# Patient Record
Sex: Male | Born: 2000 | Race: Black or African American | Hispanic: No | Marital: Single
Health system: Southern US, Community
[De-identification: ages and names within clinical notes are randomized; demographics above are authoritative.]

## PROBLEM LIST (undated history)

## (undated) DIAGNOSIS — J45909 Unspecified asthma, uncomplicated: Secondary | ICD-10-CM

## (undated) DIAGNOSIS — J302 Other seasonal allergic rhinitis: Secondary | ICD-10-CM

## (undated) HISTORY — PX: CIRCUMCISION: SHX1350

## (undated) HISTORY — PX: HERNIA REPAIR: SHX51

## (undated) HISTORY — PX: OTHER SURGICAL HISTORY: SHX169

---
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
## 2019-07-30 ENCOUNTER — Other Ambulatory Visit: Payer: Self-pay

## 2019-07-30 ENCOUNTER — Encounter (HOSPITAL_COMMUNITY): Payer: Self-pay

## 2019-07-30 ENCOUNTER — Ambulatory Visit (HOSPITAL_COMMUNITY)
Admission: EM | Admit: 2019-07-30 | Discharge: 2019-07-30 | Disposition: A | Discharge status: Home / Self Care | Payer: No Typology Code available for payment source | Attending: Emergency Medicine | Admitting: Emergency Medicine

## 2019-07-30 DIAGNOSIS — R0789 Other chest pain: Secondary | ICD-10-CM

## 2019-07-30 MED ORDER — NAPROXEN 500 MG PO TABS: 500.0000 mg | ORAL_TABLET | Freq: Two times a day (BID) | ORAL | 0 refills | Status: DC

## 2019-07-30 NOTE — ED Provider Notes (Signed)
[facility]    CSN: [id number] Arrival date & time: 07/30/19  1856      History   Chief Complaint Chief Complaint  Patient presents with  . Chest Pain    HPI Dale Gillespie is a 19 y.o. male history of asthma, allergic rhinitis, presenting today for evaluation of chest pain.  Patient developed left-sided chest pain while he was jogging approximately 1 hour ago.  Felt a tight sensation in his lower rib cage that radiates into his back.  Mainly feels this when he takes a deep breath.  Denies any associated cough, fevers chills or body aches or recent illness.  Denies any symptoms on the right side.  Denies history of similar.  Denies any history of heart issues.  Denies family history of any heart issues or death from MI at early age.  Denies hypertension, diabetes.  Denies tobacco use.  Denies cocaine use.  Denies prior DVT/PE.  Denies leg pain or leg swelling.  Denies history of chest pain with exertion, lightheadedness or dizziness with exertion/physical activity, denies prior fainting spells with exertion.  HPI  Past Medical History:  Diagnosis Date  . Asthma   . Seasonal allergies     Patient Active Problem List   Diagnosis Date Noted  . 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 benign mass on finger    . CIRCUMCISION    . HERNIA REPAIR         Home Medications    Prior to Admission medications   Medication Sig Start Date End Date Taking? Authorizing Provider  cetirizine (ZYRTEC) 10 MG tablet Take 10 mg by mouth daily.    [provider]  fluticasone (FLONASE) 50 MCG/ACT nasal spray Place into both nostrils daily.    [provider]  naproxen (NAPROSYN) 500 MG tablet Take 1 tablet (500 mg total) by mouth 2 (two) times daily. 07/30/19   Jadarian Mckay C, PA-C  omeprazole (PRILOSEC) 20 MG capsule Take 1 capsule (20 mg total) by mouth daily. 10/18/18   Linus Mako B, NP  polyethylene  glycol powder (GLYCOLAX/MIRALAX) 17 GM/SCOOP powder Take 17 g by mouth daily. 10/18/18   Georgetta Haber, NP  PROAIR HFA 108 [phone] Base) MCG/ACT inhaler USE 2 PUFFS EVERY 4 HOURS FOR COUGH/WHEEZE/SHORTNESS OF BREATH/EXERCISE 04/11/16   [provider]    Family History No family history on file.  Social History Social History   Tobacco Use  . Smoking status: Passive Smoke Exposure - Never Smoker  . Smokeless tobacco: Never Used  Substance Use Topics  . Alcohol use: No  . Drug use: Yes    Types: Marijuana    Comment: occasional use     Allergies   Patient has no known allergies.   Review of Systems Review of Systems  Constitutional: Negative for fatigue and fever.  HENT: Negative for congestion, sinus pressure and sore throat.   Eyes: Negative for photophobia, pain and visual disturbance.  Respiratory: Negative for cough and shortness of breath.   Cardiovascular: Positive for chest pain. Negative for palpitations and leg swelling.  Gastrointestinal: Negative for abdominal pain, nausea and vomiting.  Genitourinary: Negative for decreased urine volume and hematuria.  Musculoskeletal: Negative for myalgias, neck pain and neck stiffness.  Neurological: Negative for dizziness, syncope, facial asymmetry, speech difficulty, weakness, light-headedness, numbness and headaches.     Physical Exam Triage Vital Signs ED Triage Vitals  Enc Vitals Group  BP 07/30/19 1918 123/81     Pulse Rate 07/30/19 1918 93     Resp 07/30/19 1918 16     Temp 07/30/19 1918 98.1 F (36.7 C)     Temp Source 07/30/19 1918 Oral     SpO2 07/30/19 1918 98 %     Weight 07/30/19 1916 198 lb (89.8 kg)     Height --      Head Circumference --      Peak Flow --      Pain Score 07/30/19 1916 6     Pain Loc --      Pain Edu? --      Excl. in [place]? --    No data found.  Updated Vital Signs BP 123/81 (BP Location: Left Arm)   Pulse 93   Temp 98.1 F (36.7 C) (Oral)   Resp 16   Wt 198 lb  (89.8 kg)   SpO2 98%   BMI 25.77 kg/m   Visual Acuity Right Eye Distance:   Left Eye Distance:   Bilateral Distance:    Right Eye Near:   Left Eye Near:    Bilateral Near:     Physical Exam Vitals and nursing note reviewed.  Constitutional:      Appearance: He is well-developed.     Comments: No acute distress  HENT:     Head: Normocephalic and atraumatic.     Nose: Nose normal.  Eyes:     Conjunctiva/sclera: Conjunctivae normal.  Cardiovascular:     Rate and Rhythm: Normal rate.  Pulmonary:     Effort: Pulmonary effort is normal. No respiratory distress.     Comments: Breathing comfortably at rest, CTABL, no wheezing, rales or other adventitious sounds auscultated Abdominal:     General: There is no distension.  Musculoskeletal:        General: Normal range of motion.     Cervical back: Neck supple.     Comments: Anterior chest and lower rib cage on left nontender to palpation, patient reports lower ribs as area of pain deeper.  Bilateral lower legs symmetric and without calf swelling or tenderness   Skin:    General: Skin is warm and dry.  Neurological:     Mental Status: He is alert and oriented to person, place, and time.      UC Treatments / Results  Labs (all labs ordered are listed, but only abnormal results are displayed) Labs Reviewed - No data to display  EKG   Radiology No results found.  Procedures Procedures (including critical care time)  Medications Ordered in UC Medications - No data to display  Initial Impression / Assessment and Plan / UC Course  I have reviewed the triage vital signs and the nursing notes.  Pertinent labs & imaging results that were available during my care of the patient were reviewed by me and considered in my medical decision making (see chart for details).     EKG normal sinus rhythm with sinus arrhythmia, no acute signs of ischemia or infarction.  PERC negative.  Low suspicion for DVT/intrapulmonary cause.   Most suspicious of MSK/chest wall inflammation as cause of discomfort, given triggered by inspiration.  Recommending use of anti-inflammatories over the next week with close monitoring.  Discussed strict return precautions. Patient verbalized understanding and is agreeable with plan.  Final Clinical Impressions(s) / UC Diagnoses   Final diagnoses:  Chest wall pain  Atypical chest pain     Discharge Instructions     EKG  normal Please begin naprosyn twice daily with food over the next week  Please follow-up if symptoms persisting, worsening, developing increase shortness of breath, difficulty breathing, lightheadedness, dizziness or other worsening or persistent chest pain    ED Prescriptions    Medication Sig Dispense Auth. Provider   naproxen (NAPROSYN) 500 MG tablet Take 1 tablet (500 mg total) by mouth 2 (two) times daily. 30 tablet Araceli Coufal, [place] C, PA-C     PDMP not reviewed this encounter.   Lew Dawes, [place] 07/30/19 1956

## 2019-07-30 NOTE — ED Triage Notes (Signed)
Pt states he was on a jog today and began feeling left side chest pain that radiated to his back.

## 2019-07-30 NOTE — Discharge Instructions (Signed)
EKG normal Please begin naprosyn twice daily with food over the next week  Please follow-up if symptoms persisting, worsening, developing increase shortness of breath, difficulty breathing, lightheadedness, dizziness or other worsening or persistent chest pain

## 2019-08-31 ENCOUNTER — Ambulatory Visit (HOSPITAL_COMMUNITY)
Admission: EM | Admit: 2019-08-31 | Discharge: 2019-08-31 | Disposition: A | Discharge status: Home / Self Care | Payer: No Typology Code available for payment source | Attending: Family Medicine | Admitting: Family Medicine

## 2019-08-31 ENCOUNTER — Encounter (HOSPITAL_COMMUNITY): Payer: Self-pay

## 2019-08-31 ENCOUNTER — Other Ambulatory Visit: Payer: Self-pay

## 2019-08-31 DIAGNOSIS — S0081XA Abrasion of other part of head, initial encounter: Secondary | ICD-10-CM | POA: Diagnosis not present

## 2019-08-31 DIAGNOSIS — M7918 Myalgia, other site: Secondary | ICD-10-CM | POA: Diagnosis not present

## 2019-08-31 DIAGNOSIS — S161XXA Strain of muscle, fascia and tendon at neck level, initial encounter: Secondary | ICD-10-CM | POA: Diagnosis not present

## 2019-08-31 MED ORDER — BACITRACIN ZINC 500 UNIT/GM EX OINT: 1.0000 "application " | TOPICAL_OINTMENT | Freq: Two times a day (BID) | CUTANEOUS | 0 refills | Status: AC

## 2019-08-31 MED ORDER — IBUPROFEN 800 MG PO TABS: 800.0000 mg | ORAL_TABLET | Freq: Three times a day (TID) | ORAL | 0 refills | Status: DC

## 2019-08-31 MED ORDER — TIZANIDINE HCL 4 MG PO TABS: 4.0000 mg | ORAL_TABLET | Freq: Four times a day (QID) | ORAL | 0 refills | Status: DC | PRN

## 2019-08-31 NOTE — Discharge Instructions (Signed)
Take the ibuprofen 3 x a day with food This is for pain and inflammation Take the tizanidine as needed as a muscle relaxer This is useful at bedtime Apply the bacitracin ointment several times a day to your face wounds Try to avoid letting them get dry and cracked Return as needed
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
## 2020-01-23 ENCOUNTER — Ambulatory Visit: Admit: 2020-01-23 | Discharge status: Absent without leave | Payer: No Typology Code available for payment source

## 2020-03-22 ENCOUNTER — Ambulatory Visit (HOSPITAL_COMMUNITY)
Admission: EM | Admit: 2020-03-22 | Discharge: 2020-03-22 | Disposition: A | Discharge status: Home / Self Care | Payer: Self-pay | Attending: Urgent Care | Admitting: Urgent Care

## 2020-03-22 ENCOUNTER — Other Ambulatory Visit: Payer: Self-pay

## 2020-03-22 ENCOUNTER — Emergency Department (HOSPITAL_COMMUNITY): Payer: Medicaid Other

## 2020-03-22 ENCOUNTER — Emergency Department (HOSPITAL_COMMUNITY)
Admission: EM | Admit: 2020-03-22 | Discharge: 2020-03-22 | Disposition: A | Discharge status: Home / Self Care | Payer: Medicaid Other | Attending: Emergency Medicine | Admitting: Emergency Medicine

## 2020-03-22 DIAGNOSIS — Z5321 Procedure and treatment not carried out due to patient leaving prior to being seen by health care provider: Secondary | ICD-10-CM | POA: Insufficient documentation

## 2020-03-22 DIAGNOSIS — R42 Dizziness and giddiness: Secondary | ICD-10-CM | POA: Insufficient documentation

## 2020-03-22 DIAGNOSIS — R079 Chest pain, unspecified: Secondary | ICD-10-CM | POA: Insufficient documentation

## 2020-03-22 LAB — CBC
HCT: 42.1 % (ref 39.0–52.0)
Hemoglobin: 14.3 g/dL (ref 13.0–17.0)
MCH: 33.2 pg (ref 26.0–34.0)
MCHC: 34 g/dL (ref 30.0–36.0)
MCV: 97.7 fL (ref 80.0–100.0)
Platelets: 269 K/uL (ref 150–400)
RBC: 4.31 MIL/uL (ref 4.22–5.81)
RDW: 11.9 % (ref 11.5–15.5)
WBC: 4.5 K/uL (ref 4.0–10.5)
nRBC: 0 % (ref 0.0–0.2)

## 2020-03-22 LAB — BASIC METABOLIC PANEL
Anion gap: 10 (ref 5–15)
BUN: 5 mg/dL — ABNORMAL LOW (ref 6–20)
CO2: 27 mmol/L (ref 22–32)
Calcium: 9.5 mg/dL (ref 8.9–10.3)
Chloride: 101 mmol/L (ref 98–111)
Creatinine, Ser: 1.2 mg/dL (ref 0.61–1.24)
GFR, Estimated: >60 mL/min (ref >60)
Glucose, Bld: 90 mg/dL (ref 70–99)
Potassium: 3.9 mmol/L (ref 3.5–5.1)
Sodium: 138 mmol/L (ref 135–145)

## 2020-03-22 LAB — TROPONIN I (HIGH SENSITIVITY): Troponin I (High Sensitivity): 4 ng/L

## 2020-03-22 NOTE — ED Triage Notes (Addendum)
Pt in with c/o chest pain that started today. States he was at work and passed out per his coworkers. Also c/o dizziness.  Denies N/V, headache  EKG completed, shown to M. [place], [place], [place] into triage to assess pt status. Provider advised pt to be seen in ED for further workup.

## 2020-03-22 NOTE — ED Triage Notes (Signed)
Pt reports being sent by job due to passing out at 10:30 am. Pt does not remember what happened. Pt reports feeling dizzy. Pt also reports chest pain.

## 2020-03-22 NOTE — ED Notes (Signed)
Pt called 3x no response  
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
## 2022-07-27 IMAGING — CR DG CHEST 2V
2 series · 2 of 2 positions shown · non-contrast
Comparison: None.

CLINICAL DATA: Chest pain.

EXAM:
CHEST - 2 VIEW

[chest pa]
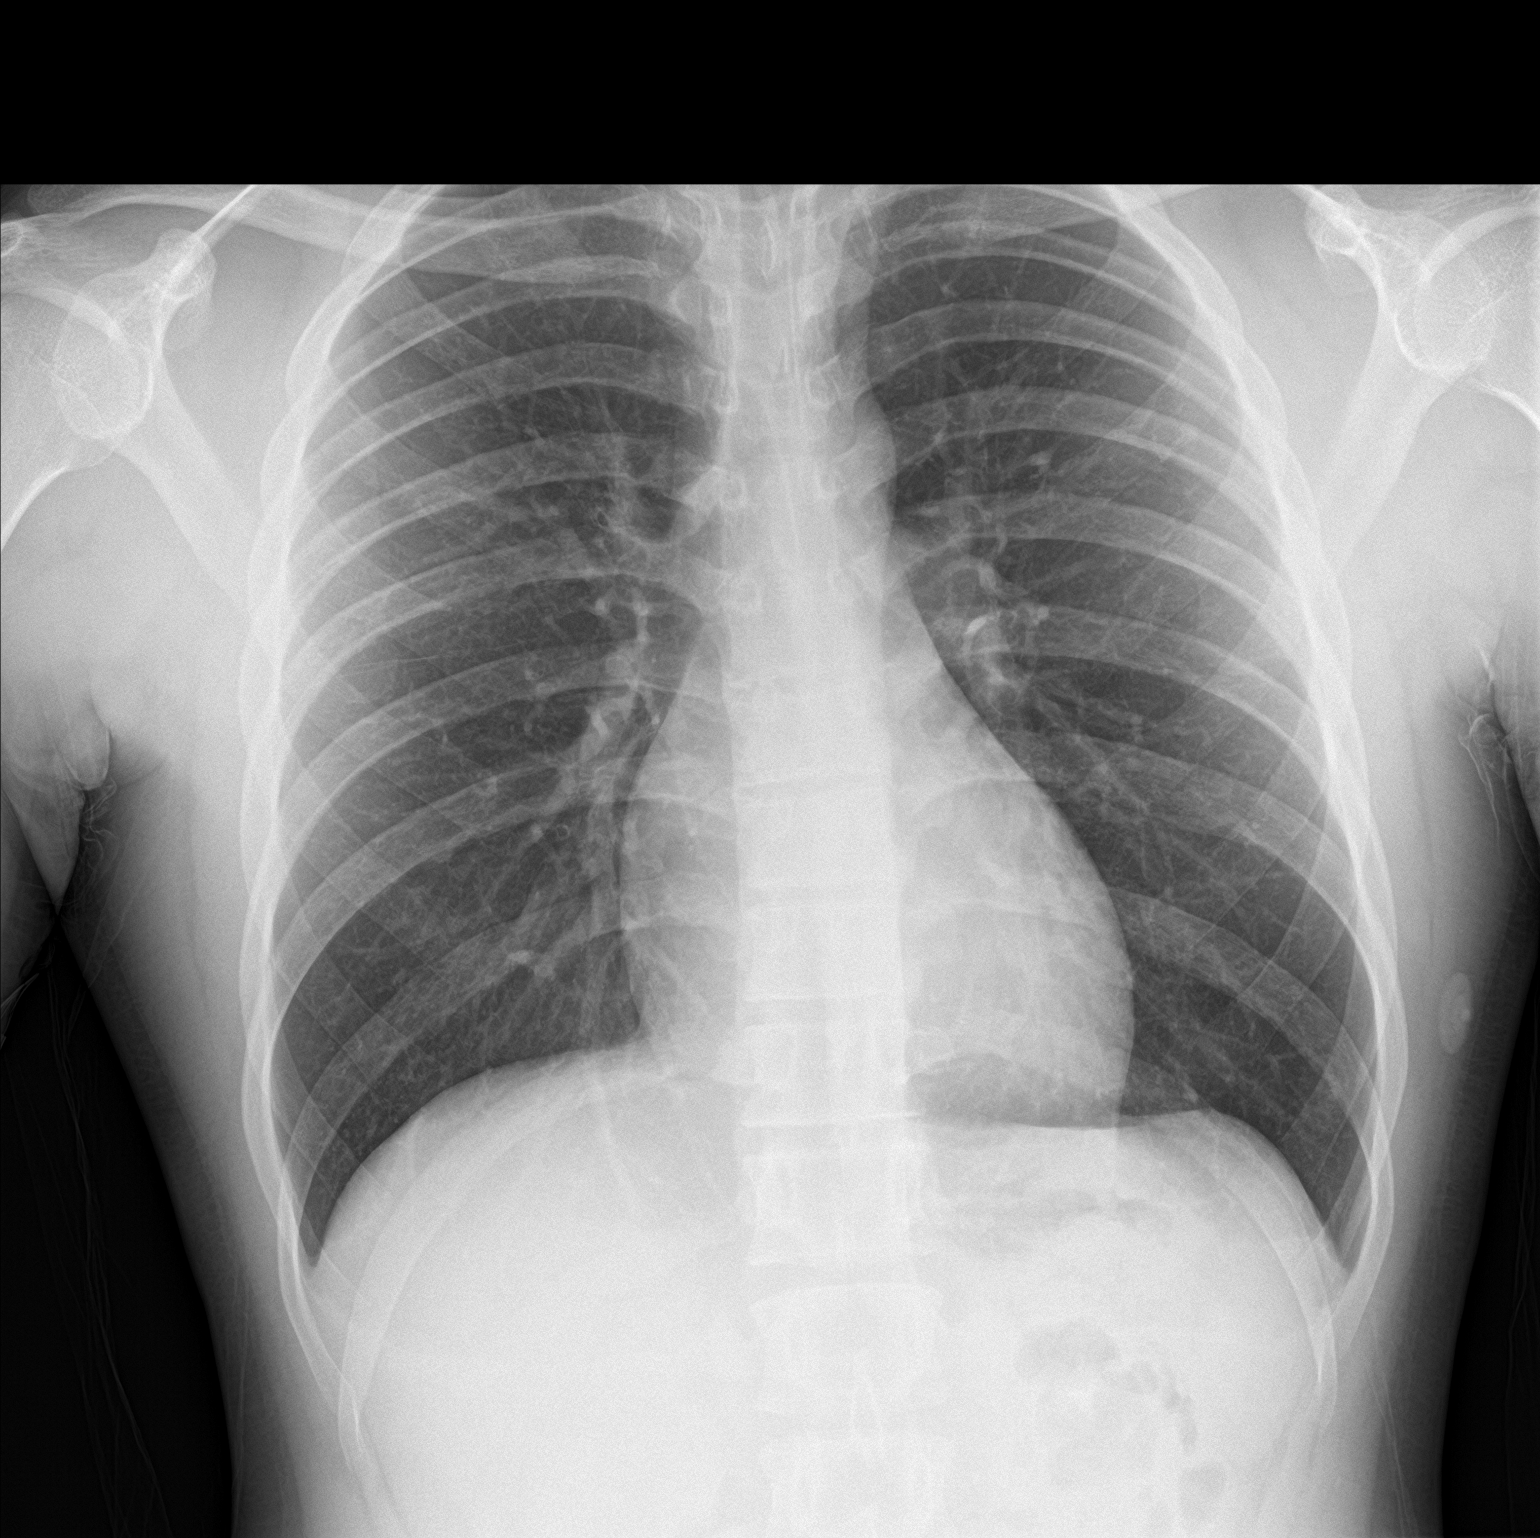

[chest lat]
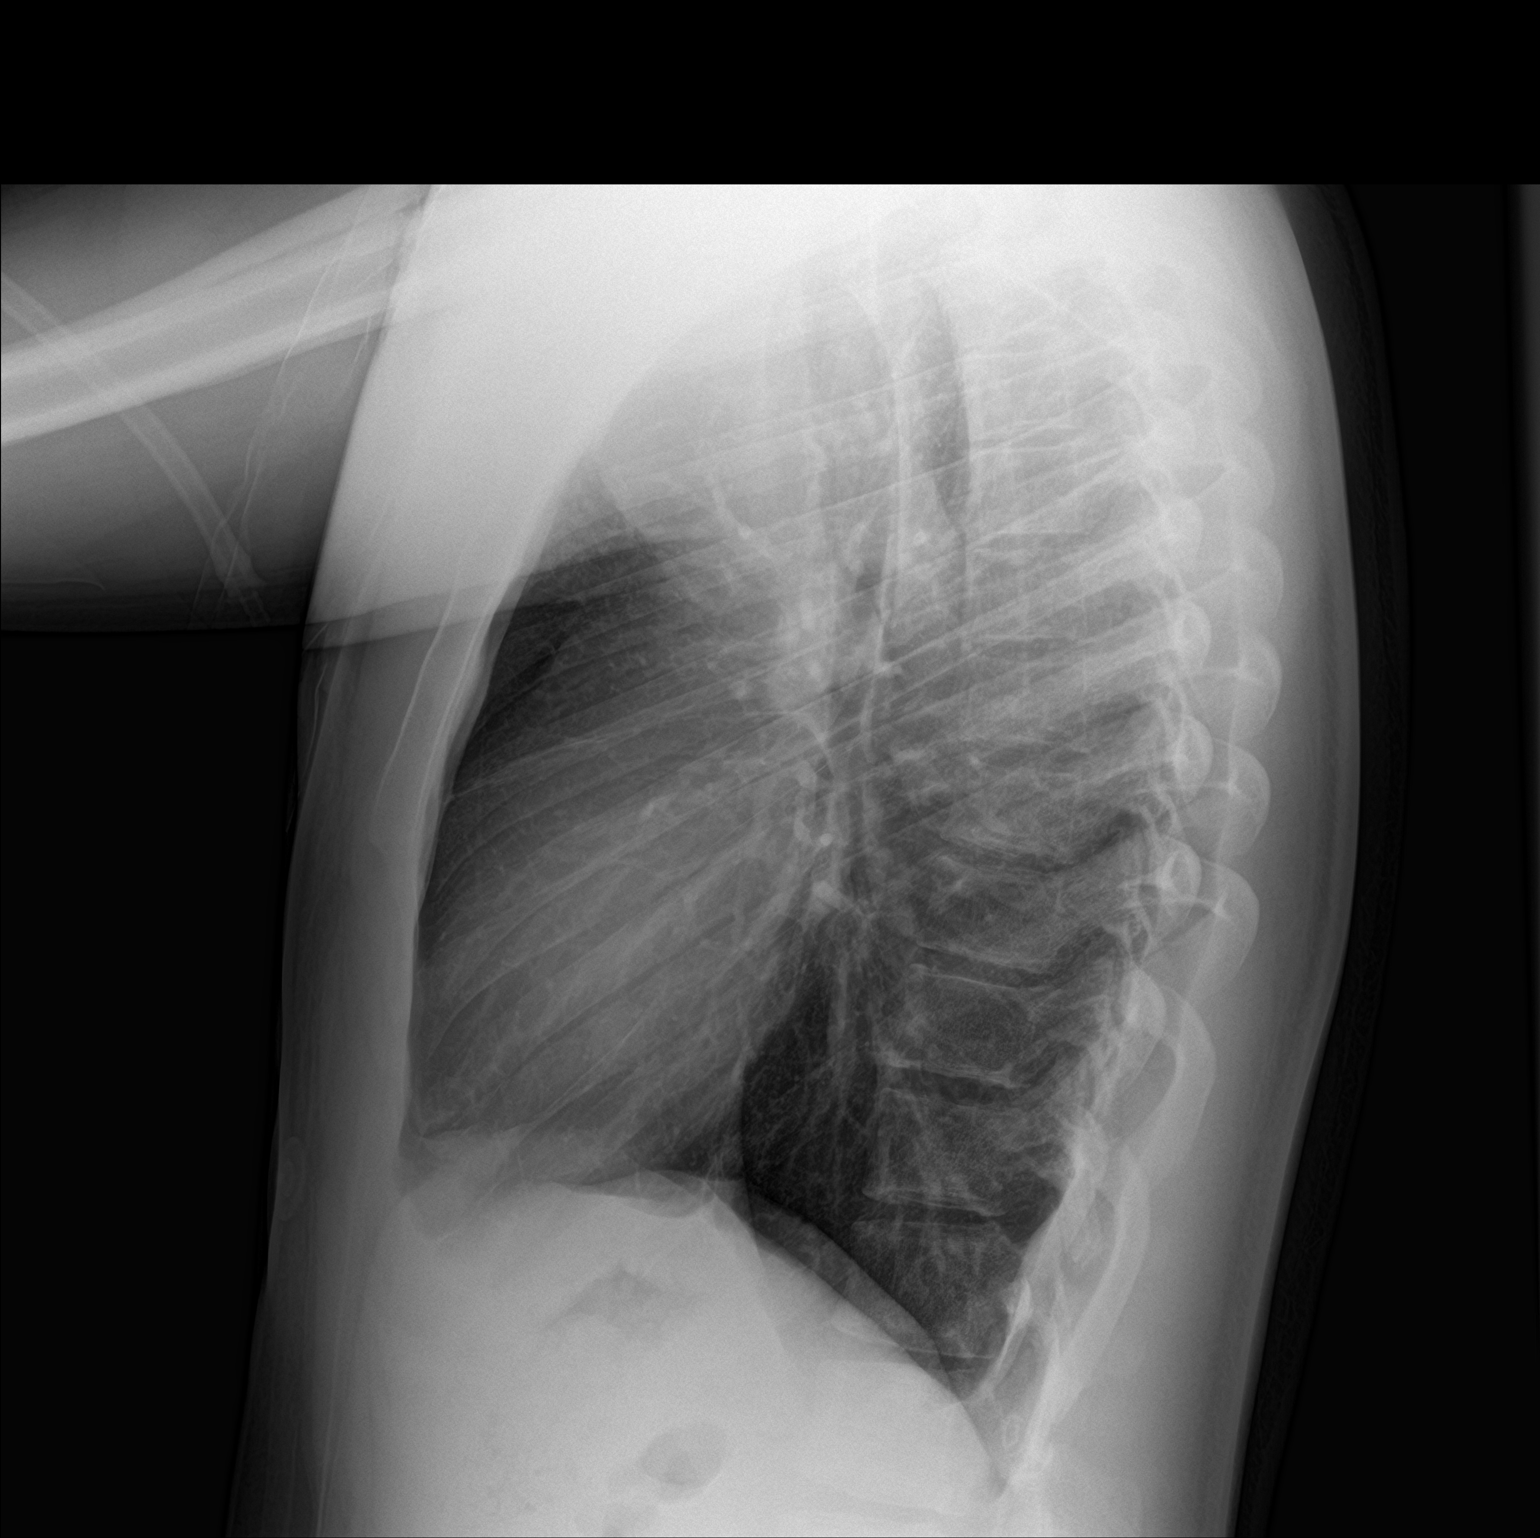

[2 of 2 positions shown; findings below may reference images not displayed]

FINDINGS: The heart size and mediastinal contours are within normal limits.
Both lungs are clear. No visible pleural effusions or pneumothorax.
The visualized skeletal structures are unremarkable.
IMPRESSION: No acute cardiopulmonary disease.

## 2023-01-16 DIAGNOSIS — Z0000 Encounter for general adult medical examination without abnormal findings: Secondary | ICD-10-CM | POA: Diagnosis not present

## 2023-11-20 ENCOUNTER — Other Ambulatory Visit: Payer: Self-pay

## 2023-11-20 ENCOUNTER — Encounter: Payer: Self-pay | Admitting: Emergency Medicine

## 2023-11-20 ENCOUNTER — Ambulatory Visit (INDEPENDENT_AMBULATORY_CARE_PROVIDER_SITE_OTHER)

## 2023-11-20 ENCOUNTER — Ambulatory Visit
Admission: EM | Admit: 2023-11-20 | Discharge: 2023-11-20 | Disposition: A | Discharge status: Home / Self Care | Attending: Family Medicine | Admitting: Family Medicine

## 2023-11-20 DIAGNOSIS — M25532 Pain in left wrist: Secondary | ICD-10-CM | POA: Diagnosis not present

## 2023-11-20 DIAGNOSIS — M79645 Pain in left finger(s): Secondary | ICD-10-CM

## 2023-11-20 MED ORDER — IBUPROFEN 800 MG PO TABS: 800.0000 mg | ORAL_TABLET | Freq: Three times a day (TID) | ORAL | 0 refills | Status: AC | PRN

## 2023-11-20 NOTE — ED Triage Notes (Signed)
 Pt here with left wrist and thumb pain after lifting heavy box yesterday; pt sts felt pop

## 2023-11-20 NOTE — ED Provider Notes (Signed)
 [facility]    CSN: [id number] Arrival date & time: 11/20/23  1133      History   Chief Complaint Chief Complaint  Patient presents with   Hand Pain    HPI Dale Gillespie is a 23 y.o. male.    Hand Pain  Here for pain in the base of his left thumb and wrist.  Yesterday he was lifting a heavy box and felt a sudden pop in the MCP joint of his left thumb.  Since then it is hurt there and it is left volar wrist on the radial side.  It hurts to flex at the MCP.  He can just slightly flex at the PIP on the left thumb.   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 Authorizing Provider  ibuprofen  (ADVIL ) 800 MG tablet Take 1 tablet (800 mg total) by mouth every 8 (eight) hours as needed (pain). 11/20/23  Yes Pattie Flaharty K, MD  bacitracin  ointment Apply 1 application topically 2 (two) times daily. 08/31/19   Maranda Jamee Jacob, MD  cetirizine (ZYRTEC) 10 MG tablet Take 10 mg by mouth daily.    [provider]  fluticasone (FLONASE) 50 MCG/ACT nasal spray Place into both nostrils daily.    [provider]  PROAIR HFA 108 [phone] Base) MCG/ACT inhaler USE 2 PUFFS EVERY 4 HOURS FOR COUGH/WHEEZE/SHORTNESS OF BREATH/EXERCISE 04/11/16   [provider]  omeprazole  (PRILOSEC) 20 MG capsule Take 1 capsule (20 mg total) by mouth daily. 10/18/18 08/31/19  Tellis Laneta NOVAK, NP    Family History History reviewed. No pertinent family history.  Social History Social History   Tobacco Use   Smoking status: Passive Smoke Exposure - Never Smoker    Smokeless tobacco: Never  Vaping Use   Vaping status: Former  Substance Use Topics   Alcohol use: No   Drug use: Yes    Types: Marijuana    Comment: occasional use     Allergies   Patient has no known allergies.   Review of Systems Review of Systems   Physical Exam Triage Vital Signs ED Triage Vitals [11/20/23 1148]  Encounter Vitals Group     BP 126/76     Girls Systolic BP Percentile      Girls Diastolic BP Percentile      Boys Systolic BP Percentile      Boys Diastolic BP Percentile      Pulse Rate 69     Resp 18     Temp 98.3 F (36.8 C)     Temp Source Oral     SpO2 97 %     Weight      Height      Head Circumference      Peak Flow      Pain Score 3     Pain Loc      Pain Education      Exclude from Growth Chart    No data found.  Updated Vital Signs BP 126/76 (BP Location: Left Arm)   Pulse 69   Temp 98.3 F (36.8 C) (Oral)   Resp 18   SpO2 97%   Visual Acuity Right Eye Distance:   Left Eye Distance:   Bilateral Distance:    Right Eye Near:   Left Eye Near:    Bilateral Near:     Physical Exam Vitals reviewed.  Constitutional:      General: He is not in acute distress.    Appearance: He is not toxic-appearing.  Cardiovascular:     Rate and Rhythm: Normal rate and regular rhythm.  Pulmonary:     Effort: Pulmonary effort is normal.     Breath sounds: Normal breath sounds.  Skin:    Coloration: Skin is not jaundiced or pale.  Neurological:     General: No focal deficit present.     Mental Status: He is alert and oriented to person, place, and time.  Psychiatric:        Behavior: Behavior normal.      UC Treatments / Results  Labs (all labs ordered are listed, but only abnormal results are displayed) Labs Reviewed - No data to display  EKG   Radiology No results found.  Procedures Procedures (including critical care time)  Medications Ordered in UC Medications - No data to display  Initial Impression / Assessment  and Plan / UC Course  I have reviewed the triage vital signs and the nursing notes.  Pertinent labs & imaging results that were available during my care of the patient were reviewed by me and considered in my medical decision making (see chart for details).     By my review there are no fractures on the thumb or wrist x-rays.  I suspect a ligamentous injury.  Wrist brace with thumb extension is applied here.  Ibuprofen  prescription strength is sent to the pharmacy  He is given contact information for orthopedic/hand  He will follow-up with his primary care Final Clinical Impressions(s) / UC Diagnoses   Final diagnoses:  Pain of left thumb  Pain in left wrist     Discharge Instructions      Your x-rays do not show any broken bones by my review. The radiologist will also read your x-ray, and if their interpretation differs significantly from mine, and the management of your condition would change, we will call you.  Take ibuprofen  800 mg--1 tab every 8 hours as needed for pain.  Follow-up with your primary care about this issue and call the orthopedic hand specialist for an appointment and evaluation.     ED Prescriptions     Medication Sig Dispense Auth. Provider   ibuprofen  (ADVIL ) 800 MG tablet Take 1 tablet (800 mg total) by mouth every 8 (eight) hours as needed (pain). 21 tablet Keita Demarco K, MD      PDMP not reviewed this encounter.   Vonna Sharlet POUR, MD 11/20/23 [phone]

## 2023-11-20 NOTE — Discharge Instructions (Addendum)
 Your x-rays do not show any broken bones by my review. The radiologist will also read your x-ray, and if their interpretation differs significantly from mine, and the management of your condition would change, we will call you.  Take ibuprofen  800 mg--1 tab every 8 hours as needed for pain.  Follow-up with your primary care about this issue and call the orthopedic hand specialist for an appointment and evaluation.

## 2024-01-04 DIAGNOSIS — U071 COVID-19: Secondary | ICD-10-CM | POA: Diagnosis not present

## 2024-01-04 DIAGNOSIS — J45909 Unspecified asthma, uncomplicated: Secondary | ICD-10-CM | POA: Diagnosis not present

## 2024-03-18 ENCOUNTER — Telehealth: Admitting: Physician Assistant

## 2024-03-18 DIAGNOSIS — R21 Rash and other nonspecific skin eruption: Secondary | ICD-10-CM | POA: Diagnosis not present

## 2024-03-18 MED ORDER — CLOTRIMAZOLE-BETAMETHASONE 1-0.05 % EX CREA: 1.0000 | TOPICAL_CREAM | Freq: Every day | CUTANEOUS | 0 refills | Status: AC

## 2024-03-18 NOTE — Progress Notes (Signed)
 Virtual Visit Consent   Dale Gillespie, you are scheduled for a virtual visit with a [facility] provider today. Just as with appointments in the office, your consent must be obtained to participate. Your consent will be active for this visit and any virtual visit you may have with one of our providers in the next 365 days. If you have a MyChart account, a copy of this consent can be sent to you electronically.  As this is a virtual visit, video technology does not allow for your provider to perform a traditional examination. This may limit your provider's ability to fully assess your condition. If your provider identifies any concerns that need to be evaluated in person or the need to arrange testing (such as labs, EKG, etc.), we will make arrangements to do so. Although advances in technology are sophisticated, we cannot ensure that it will always work on either your end or our end. If the connection with a video visit is poor, the visit may have to be switched to a telephone visit. With either a video or telephone visit, we are not always able to ensure that we have a secure connection.  By engaging in this virtual visit, you consent to the provision of healthcare and authorize for your insurance to be billed (if applicable) for the services provided during this visit. Depending on your insurance coverage, you may receive a charge related to this service.  I need to obtain your verbal consent now. Are you willing to proceed with your visit today? Dale Gillespie has provided verbal consent on 03/18/2024 for a virtual visit (video or telephone). Dale Gillespie, [place]  Date: 03/18/2024 5:03 PM   Virtual Visit via Video Note   I, Dale Gillespie, connected with  Dale Gillespie  ([id number], 07-10-2000) on 03/18/24 at  5:00 PM EST by a video-enabled telemedicine application and verified that I am speaking with the correct person using two identifiers.  Location: Patient: Virtual Visit  Location Patient: Home Provider: Virtual Visit Location Provider: Home Office   I discussed the limitations of evaluation and management by telemedicine and the availability of in person appointments. The patient expressed understanding and agreed to proceed.    History of Present Illness: Dale Gillespie is a 23 y.o. who identifies as a male who was assigned male at birth, and is being seen today for pruritic rash of umbilical area, left clavicular area and behind the knees bilaterally. Notes first noted two days ago. Is not painful. Denies fever, chills, recent travel or known sick contact.  Denies change to detergents. Recent change in soap.   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    Allergies: No Known Allergies Medications:  Current Outpatient Medications:    clotrimazole-betamethasone (LOTRISONE) cream, Apply 1 Application topically daily., Disp: 30 g, Rfl: 0   bacitracin  ointment, Apply 1 application topically 2 (two) times daily., Disp: 14 g, Rfl: 0   cetirizine (ZYRTEC) 10 MG tablet, Take 10 mg by mouth daily., Disp: , Rfl:    fluticasone (FLONASE) 50 MCG/ACT nasal spray, Place into both nostrils daily., Disp: , Rfl:    ibuprofen  (ADVIL ) 800 MG tablet, Take 1 tablet (800 mg total) by mouth every 8 (eight) hours as needed (pain)., Disp: 21 tablet, Rfl: 0   PROAIR HFA 108 (90 Base) MCG/ACT inhaler, USE 2 PUFFS EVERY 4 HOURS FOR COUGH/WHEEZE/SHORTNESS OF BREATH/EXERCISE, Disp: , Rfl:  0  Observations/Objective: Patient is well-developed, well-nourished in no acute distress.  Resting comfortably at home.  Head is normocephalic, atraumatic.  No labored breathing.  Speech is clear and coherent with logical content.  Patient is alert and oriented at baseline.  Umbilical rash with eczematous patch noted. Some flaking noted more centrally.  Annular rash with raised borders noted of left clavicular area with central  clearing, about 3 cm in diameter.  Unable to see the rash of back of legs bilaterally at time of video exam.  Assessment and Plan: 1. Rash (Primary) - clotrimazole-betamethasone (LOTRISONE) cream; Apply 1 Application topically daily.  Dispense: 30 g; Refill: 0  Suspect fungal rash, also with some eczematous dermatitis. Avoid scented/dyes soaps, lotions. Keep skin clean and dry. Start Lotrisone per orders. In-person follow-up precautions reviewed.   Follow Up Instructions: I discussed the assessment and treatment plan with the patient. The patient was provided an opportunity to ask questions and all were answered. The patient agreed with the plan and demonstrated an understanding of the instructions.  A copy of instructions were sent to the patient via MyChart unless otherwise noted below.   The patient was advised to call back or seek an in-person evaluation if the symptoms worsen or if the condition fails to improve as anticipated.    Dale Velma Lunger, PA-C

## 2024-03-18 NOTE — Patient Instructions (Signed)
  Dale Gillespie, thank you for joining Dale Velma Lunger, PA-C for today's virtual visit.  While this provider is not your primary care provider (PCP), if your PCP is located in our provider database this encounter information will be shared with them immediately following your visit.   Dale G[facility]pie account gives you access to today's visit and all your visits, tests, and labs performed at [facility]  click here if you don't have Dale [facility] account or go to [email or web address]  Consent: (Patient) Dale Gillespie provided verbal consent for this virtual visit at the beginning of the encounter.  Current Medications:  Current Outpatient Medications:    bacitracin  ointment, Apply 1 application topically 2 (two) times daily., Disp: 14 g, Rfl: 0   cetirizine (ZYRTEC) 10 MG tablet, Take 10 mg by mouth daily., Disp: , Rfl:    fluticasone (FLONASE) 50 MCG/ACT nasal spray, Place into both nostrils daily., Disp: , Rfl:    ibuprofen  (ADVIL ) 800 MG tablet, Take 1 tablet (800 mg total) by mouth every 8 (eight) hours as needed (pain)., Disp: 21 tablet, Rfl: 0   PROAIR HFA 108 (90 Base) MCG/ACT inhaler, USE 2 PUFFS EVERY 4 HOURS FOR COUGH/WHEEZE/SHORTNESS OF BREATH/EXERCISE, Disp: , Rfl: 0   Medications ordered in this encounter:  No orders of the defined types were placed in this encounter.    *If you need refills on other medications prior to your next appointment, please contact your pharmacy*  Follow-Up: Call back or seek an in-person evaluation if the symptoms worsen or if the condition fails to improve as anticipated.  [facility] [phone]  Other Instructions Please keep the skin clean and dry. Avoid scented or dyed soaps or lotions. Apply prescription cream as directed for up to two weeks. If you note any non-resolving, new, or worsening symptoms despite treatment, please seek an in-person evaluation ASAP.    If you have been  instructed to have an in-person evaluation today at [facility] local Urgent Care facility, please use the link below. It will take you to Dale list of all of our available [facility], including address, phone number and hours of operation. Please do not delay care.  [facility]  If you or Dale family member do not have Dale primary care provider, use the link below to schedule Dale visit and establish care. When you choose [facility] [place] primary care physician or advanced practice provider, you gain Dale long-term partner in health. Find [facility] Primary Care Provider  Learn more about [facility] in-office and virtual care options:  - Get Care Now
